# Patient Record
Sex: Male | Born: 1980 | Race: White | Hispanic: No | Marital: Single | Smoking: Never smoker
Health system: Southern US, Community
[De-identification: ages and names within clinical notes are randomized; demographics above are authoritative.]

## PROBLEM LIST (undated history)

## (undated) DIAGNOSIS — S52501A Unspecified fracture of the lower end of right radius, initial encounter for closed fracture: Secondary | ICD-10-CM

## (undated) DIAGNOSIS — F40231 Fear of injections and transfusions: Secondary | ICD-10-CM

## (undated) DIAGNOSIS — R12 Heartburn: Secondary | ICD-10-CM

## (undated) HISTORY — PX: WISDOM TOOTH EXTRACTION: SHX21

## (undated) HISTORY — PX: MULTIPLE TOOTH EXTRACTIONS: SHX2053

---

## 2000-08-02 ENCOUNTER — Emergency Department (HOSPITAL_COMMUNITY): Admission: EM | Admit: 2000-08-02 | Discharge: 2000-08-02 | Payer: Self-pay

## 2015-04-04 ENCOUNTER — Ambulatory Visit: Payer: Self-pay

## 2015-06-27 ENCOUNTER — Encounter (HOSPITAL_COMMUNITY): Payer: Self-pay | Admitting: Emergency Medicine

## 2015-06-27 ENCOUNTER — Emergency Department (HOSPITAL_COMMUNITY): Payer: Worker's Compensation

## 2015-06-27 ENCOUNTER — Emergency Department (HOSPITAL_COMMUNITY)
Admission: EM | Admit: 2015-06-27 | Discharge: 2015-06-28 | Disposition: A | Discharge status: Home / Self Care | Payer: Worker's Compensation | Attending: Emergency Medicine | Admitting: Emergency Medicine

## 2015-06-27 DIAGNOSIS — Y92828 Other wilderness area as the place of occurrence of the external cause: Secondary | ICD-10-CM | POA: Diagnosis not present

## 2015-06-27 DIAGNOSIS — S52591A Other fractures of lower end of right radius, initial encounter for closed fracture: Secondary | ICD-10-CM | POA: Diagnosis not present

## 2015-06-27 DIAGNOSIS — Y9355 Activity, bike riding: Secondary | ICD-10-CM | POA: Diagnosis not present

## 2015-06-27 DIAGNOSIS — S52611A Displaced fracture of right ulna styloid process, initial encounter for closed fracture: Secondary | ICD-10-CM | POA: Insufficient documentation

## 2015-06-27 DIAGNOSIS — S5291XA Unspecified fracture of right forearm, initial encounter for closed fracture: Secondary | ICD-10-CM

## 2015-06-27 DIAGNOSIS — S6991XA Unspecified injury of right wrist, hand and finger(s), initial encounter: Secondary | ICD-10-CM | POA: Diagnosis present

## 2015-06-27 DIAGNOSIS — S52201A Unspecified fracture of shaft of right ulna, initial encounter for closed fracture: Secondary | ICD-10-CM

## 2015-06-27 DIAGNOSIS — Y998 Other external cause status: Secondary | ICD-10-CM | POA: Insufficient documentation

## 2015-06-27 DIAGNOSIS — Z791 Long term (current) use of non-steroidal anti-inflammatories (NSAID): Secondary | ICD-10-CM | POA: Diagnosis not present

## 2015-06-27 DIAGNOSIS — S52501A Unspecified fracture of the lower end of right radius, initial encounter for closed fracture: Secondary | ICD-10-CM

## 2015-06-27 HISTORY — DX: Unspecified fracture of the lower end of right radius, initial encounter for closed fracture: S52.501A
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
## 2015-06-27 NOTE — ED Notes (Signed)
Bed: [id number] Expected date:  Expected time:  Means of arrival:  Comments: EMS/25F/fall/? R hip deformity

## 2015-06-28 MED ORDER — HYDROCODONE-ACETAMINOPHEN 5-325 MG PO TABS: 1.0000 | ORAL_TABLET | Freq: Four times a day (QID) | ORAL | Status: DC | PRN

## 2015-06-28 NOTE — Discharge Instructions (Signed)
°Forearm Fracture °A forearm fracture is a break in one or both of the bones of your arm that are between the elbow and the wrist. Your forearm is made up of two bones: °· Radius. This is the bone on the inside of your arm near your thumb. °· Ulna. This is the bone on the outside of your arm near your little finger. °Middle forearm fractures usually break both the radius and the ulna. Most forearm fractures that involve both the ulna and radius will require surgery. °CAUSES °Common causes of this type of fracture include: °· Falling on an outstretched arm. °· Accidents, such as a car or bike accident. °· A hard, direct hit to the middle part of your arm. °RISK FACTORS °You may be at higher risk for this type of fracture if: °· You play contact sports. °· You have a condition that causes your bones to be weak or thin (osteoporosis). °SIGNS AND SYMPTOMS °A forearm fracture causes pain immediately after the injury. Other signs and symptoms include: °· An abnormal bend or bump in your arm (deformity). °· Swelling. °· Numbness or tingling. °· Tenderness. °· Inability to turn your hand from side to side (rotate). °· Bruising. °DIAGNOSIS °Your health care provider may diagnose a forearm fracture based on: °· Your symptoms. °· Your medical history, including any recent injury. °· A physical exam. Your health care provider will look for any deformity and feel for tenderness over the break. Your health care provider will also check whether the bones are out of place. °· An X-ray exam to confirm the diagnosis and learn more about the type of fracture. °TREATMENT °The goals of treatment are to get the bone or bones in proper position for healing and to keep the bones from moving so they will heal over time. Your treatment will depend on many factors, especially the type of fracture that you have. °· If the fractured bone or bones: °¨ Are in the correct position (nondisplaced), you may only need to wear a cast or a  splint. °¨ Have a slightly displaced fracture, you may need to have the bones moved back into place manually (closed reduction) before the splint or cast is put on. °· You may have a temporary splint before you have a cast. The splint allows room for some swelling. After a few days, a cast can replace the splint. °· You may have to wear the cast for 6-8 weeks or as directed by your health care provider. °· The cast may be changed after about 3 weeks or as directed by your health care provider. °· After your cast is removed, you may need physical therapy to regain full movement in your wrist or elbow. °· You may need emergency surgery if you have: °¨ A fractured bone or bones that are out of position (displaced). °¨ A fracture with multiple fragments (comminuted fracture). °¨ A fracture that breaks the skin (open fracture). This type of fracture may require surgical wires, plates, or screws to hold the bone or bones in place. °· You may have X-rays every couple of weeks to check on your healing. °HOME CARE INSTRUCTIONS °If You Have a Cast: °· Do not stick anything inside the cast to scratch your skin. Doing that increases your risk of infection. °· Check the skin around the cast every day. Report any concerns to your health care provider. You may put lotion on dry skin around the edges of the cast. Do not apply lotion to the skin   underneath the cast. °If You Have a Splint: °· Wear it as directed by your health care provider. Remove it only as directed by your health care provider. °· Loosen the splint if your fingers become numb and tingle, or if they turn cold and blue. °Bathing °· Cover the cast or splint with a watertight plastic bag to protect it from water while you bathe or shower. Do not let the cast or splint get wet. °Managing Pain, Stiffness, and Swelling °· If directed, apply ice to the injured area: °¨ Put ice in a plastic bag. °¨ Place a towel between your skin and the bag. °¨ Leave the ice on for 20  minutes, 2-3 times a day. °· Move your fingers often to avoid stiffness and to lessen swelling. °· Raise the injured area above the level of your heart while you are sitting or lying down. °Driving °· Do not drive or operate heavy machinery while taking pain medicine. °· Do not drive while wearing a cast or splint on a hand that you use for driving. °Activity °· Return to your normal activities as directed by your health care provider. Ask your health care provider what activities are safe for you. °· Perform range-of-motion exercises only as directed by your health care provider. °Safety °· Do not use your injured limb to support your body weight until your health care provider says that you can. °General Instructions °· Do not put pressure on any part of the cast or splint until it is fully hardened. This may take several hours. °· Keep the cast or splint clean and dry. °· Do not use any tobacco products, including cigarettes, chewing tobacco, or electronic cigarettes. Tobacco can delay bone healing. If you need help quitting, ask your health care provider. °· Take medicines only as directed by your health care provider. °· Keep all follow-up visits as directed by your health care provider. This is important. °SEEK MEDICAL CARE IF: °· Your pain medicine is not helping. °· Your cast or splint becomes wet or damaged or suddenly feels too tight. °· Your cast becomes loose. °· You have more severe pain or swelling than you did before the cast. °· You have severe pain when you stretch your fingers. °· You continue to have pain or stiffness in your elbow or your wrist after your cast is removed. °SEEK IMMEDIATE MEDICAL CARE IF: °· You cannot move your fingers. °· You lose feeling in your fingers or your hand. °· Your hand or your fingers turn cold and pale or blue. °· You notice a bad smell coming from your cast. °· You have drainage from underneath your cast. °· You have new stains from blood or drainage that is coming  through your cast. °  °This information is not intended to replace advice given to you by your health care provider. Make sure you discuss any questions you have with your health care provider. °  °Document Released: 08/21/2000 Document Revised: 09/14/2014 Document Reviewed: 04/09/2014 °Elsevier Interactive Patient Education ©2016 [employer] ° ° °Cast or Splint Care °Casts and splints support injured limbs and keep bones from moving while they heal. It is important to care for your cast or splint at home.   °HOME CARE INSTRUCTIONS °· Keep the cast or splint uncovered during the drying period. It can take 24 to 48 hours to dry if it is made of plaster. A fiberglass cast will dry in less than 1 hour. °· Do not rest the cast on anything harder   than a pillow for the first 24 hours. °· Do not put weight on your injured limb or apply pressure to the cast until your health care provider gives you permission. °· Keep the cast or splint dry. Wet casts or splints can lose their shape and may not support the limb as well. A wet cast that has lost its shape can also create harmful pressure on your skin when it dries. Also, wet skin can become infected. °· Cover the cast or splint with a plastic bag when bathing or when out in the rain or snow. If the cast is on the trunk of the body, take sponge baths until the cast is removed. °· If your cast does become wet, dry it with a towel or a blow dryer on the cool setting only. °· Keep your cast or splint clean. Soiled casts may be wiped with a moistened cloth. °· Do not place any hard or soft foreign objects under your cast or splint, such as cotton, toilet paper, lotion, or powder. °· Do not try to scratch the skin under the cast with any object. The object could get stuck inside the cast. Also, scratching could lead to an infection. If itching is a problem, use a blow dryer on a cool setting to relieve discomfort. °· Do not trim or cut your cast or remove padding from inside of  it. °· Exercise all joints next to the injury that are not immobilized by the cast or splint. For example, if you have a long leg cast, exercise the hip joint and toes. If you have an arm cast or splint, exercise the shoulder, elbow, thumb, and fingers. °· Elevate your injured arm or leg on 1 or 2 pillows for the first 1 to 3 days to decrease swelling and pain. It is best if you can comfortably elevate your cast so it is higher than your heart. °SEEK MEDICAL CARE IF:  °· Your cast or splint cracks. °· Your cast or splint is too tight or too loose. °· You have unbearable itching inside the cast. °· Your cast becomes wet or develops a soft spot or area. °· You have a bad smell coming from inside your cast. °· You get an object stuck under your cast. °· Your skin around the cast becomes red or raw. °· You have new pain or worsening pain after the cast has been applied. °SEEK IMMEDIATE MEDICAL CARE IF:  °· You have fluid leaking through the cast. °· You are unable to move your fingers or toes. °· You have discolored (blue or white), cool, painful, or very swollen fingers or toes beyond the cast. °· You have tingling or numbness around the injured area. °· You have severe pain or pressure under the cast. °· You have any difficulty with your breathing or have shortness of breath. °· You have chest pain. °  °This information is not intended to replace advice given to you by your health care provider. Make sure you discuss any questions you have with your health care provider. °  °Document Released: 08/21/2000 Document Revised: 06/14/2013 Document Reviewed: 03/02/2013 °Elsevier Interactive Patient Education ©2016 [employer] ° °

## 2015-07-01 ENCOUNTER — Encounter (HOSPITAL_BASED_OUTPATIENT_CLINIC_OR_DEPARTMENT_OTHER): Payer: Self-pay | Admitting: *Deleted

## 2015-07-01 ENCOUNTER — Other Ambulatory Visit: Payer: Self-pay | Admitting: Orthopedic Surgery

## 2015-07-04 ENCOUNTER — Ambulatory Visit (HOSPITAL_BASED_OUTPATIENT_CLINIC_OR_DEPARTMENT_OTHER): Payer: Worker's Compensation | Admitting: Anesthesiology

## 2015-07-04 ENCOUNTER — Encounter (HOSPITAL_BASED_OUTPATIENT_CLINIC_OR_DEPARTMENT_OTHER)
Admission: RE | Disposition: A | Discharge status: Home / Self Care | Payer: Self-pay | Source: Ambulatory Visit | Attending: Orthopedic Surgery

## 2015-07-04 ENCOUNTER — Ambulatory Visit (HOSPITAL_BASED_OUTPATIENT_CLINIC_OR_DEPARTMENT_OTHER)
Admission: RE | Admit: 2015-07-04 | Discharge: 2015-07-04 | Disposition: A | Discharge status: Home / Self Care | Payer: Worker's Compensation | Source: Ambulatory Visit | Attending: Orthopedic Surgery | Admitting: Orthopedic Surgery

## 2015-07-04 ENCOUNTER — Encounter (HOSPITAL_BASED_OUTPATIENT_CLINIC_OR_DEPARTMENT_OTHER): Payer: Self-pay | Admitting: *Deleted

## 2015-07-04 DIAGNOSIS — S52571A Other intraarticular fracture of lower end of right radius, initial encounter for closed fracture: Secondary | ICD-10-CM | POA: Insufficient documentation

## 2015-07-04 DIAGNOSIS — Y990 Civilian activity done for income or pay: Secondary | ICD-10-CM | POA: Diagnosis not present

## 2015-07-04 DIAGNOSIS — S52501A Unspecified fracture of the lower end of right radius, initial encounter for closed fracture: Secondary | ICD-10-CM | POA: Diagnosis present

## 2015-07-04 HISTORY — DX: Heartburn: R12

## 2015-07-04 HISTORY — DX: Fear of injections and transfusions: F40.231

## 2015-07-04 HISTORY — DX: Unspecified fracture of the lower end of right radius, initial encounter for closed fracture: S52.501A

## 2015-07-04 HISTORY — PX: OPEN REDUCTION INTERNAL FIXATION (ORIF) DISTAL RADIAL FRACTURE: SHX5989

## 2015-07-04 SURGERY — OPEN REDUCTION INTERNAL FIXATION (ORIF) DISTAL RADIUS FRACTURE
Anesthesia: General | Site: Wrist | Laterality: Right

## 2015-07-04 MED ORDER — GLYCOPYRROLATE 0.2 MG/ML IJ SOLN: 0.2000 mg | Freq: Once | INTRAMUSCULAR | Status: DC | PRN

## 2015-07-04 MED ORDER — OXYCODONE HCL 5 MG/5ML PO SOLN
5.0000 mg | Freq: Once | ORAL | Status: AC | PRN
Start: 2015-07-04 — End: 2015-07-04

## 2015-07-04 MED ORDER — MIDAZOLAM HCL 2 MG/2ML IJ SOLN
INTRAMUSCULAR | Status: AC
  Filled 2015-07-04: qty 2

## 2015-07-04 MED ORDER — FENTANYL CITRATE (PF) 100 MCG/2ML IJ SOLN
50.0000 ug | INTRAMUSCULAR | Status: AC | PRN
  Administered 2015-07-04: 100 ug via INTRAVENOUS
  Administered 2015-07-04 (×2): 25 ug via INTRAVENOUS

## 2015-07-04 MED ORDER — HYDROMORPHONE HCL 1 MG/ML IJ SOLN
INTRAMUSCULAR | Status: AC
  Filled 2015-07-04: qty 1

## 2015-07-04 MED ORDER — ONDANSETRON HCL 4 MG/2ML IJ SOLN
INTRAMUSCULAR | Status: AC
  Filled 2015-07-04: qty 2

## 2015-07-04 MED ORDER — MIDAZOLAM HCL 2 MG/2ML IJ SOLN
INTRAMUSCULAR | Status: AC
  Filled 2015-07-04: qty 4

## 2015-07-04 MED ORDER — DEXAMETHASONE SODIUM PHOSPHATE 10 MG/ML IJ SOLN
INTRAMUSCULAR | Status: AC
  Filled 2015-07-04: qty 1

## 2015-07-04 MED ORDER — FENTANYL CITRATE (PF) 100 MCG/2ML IJ SOLN
INTRAMUSCULAR | Status: AC
  Filled 2015-07-04: qty 4

## 2015-07-04 MED ORDER — OXYCODONE HCL 5 MG PO TABS
ORAL_TABLET | ORAL | Status: AC
  Filled 2015-07-04: qty 1

## 2015-07-04 MED ORDER — HYDROMORPHONE HCL 1 MG/ML IJ SOLN
0.2500 mg | INTRAMUSCULAR | Status: DC | PRN
Start: 2015-07-04 — End: 2015-07-04
  Administered 2015-07-04 (×2): 0.5 mg via INTRAVENOUS

## 2015-07-04 MED ORDER — CHLORHEXIDINE GLUCONATE 4 % EX LIQD: 60.0000 mL | Freq: Once | CUTANEOUS | Status: DC

## 2015-07-04 MED ORDER — OXYCODONE HCL 5 MG PO TABS
5.0000 mg | ORAL_TABLET | Freq: Once | ORAL | Status: AC | PRN
  Administered 2015-07-04: 5 mg via ORAL

## 2015-07-04 MED ORDER — CEFAZOLIN SODIUM-DEXTROSE 2-3 GM-% IV SOLR
INTRAVENOUS | Status: AC
  Filled 2015-07-04: qty 50

## 2015-07-04 MED ORDER — PROPOFOL 10 MG/ML IV BOLUS
INTRAVENOUS | Status: DC | PRN
  Administered 2015-07-04: 200 mg via INTRAVENOUS

## 2015-07-04 MED ORDER — ONDANSETRON HCL 4 MG/2ML IJ SOLN
INTRAMUSCULAR | Status: DC | PRN
  Administered 2015-07-04: 4 mg via INTRAVENOUS

## 2015-07-04 MED ORDER — DEXAMETHASONE SODIUM PHOSPHATE 4 MG/ML IJ SOLN
INTRAMUSCULAR | Status: DC | PRN
  Administered 2015-07-04: 10 mg via INTRAVENOUS

## 2015-07-04 MED ORDER — LIDOCAINE HCL (CARDIAC) 20 MG/ML IV SOLN
INTRAVENOUS | Status: AC
  Filled 2015-07-04: qty 5

## 2015-07-04 MED ORDER — LACTATED RINGERS IV SOLN
INTRAVENOUS | Status: DC
  Administered 2015-07-04 (×2): via INTRAVENOUS
  Administered 2015-07-04: 10 mL/h via INTRAVENOUS

## 2015-07-04 MED ORDER — SCOPOLAMINE 1 MG/3DAYS TD PT72: 1.0000 | MEDICATED_PATCH | Freq: Once | TRANSDERMAL | Status: DC | PRN

## 2015-07-04 MED ORDER — OXYCODONE-ACETAMINOPHEN 5-325 MG PO TABS: ORAL_TABLET | ORAL | Status: AC

## 2015-07-04 MED ORDER — PROMETHAZINE HCL 25 MG/ML IJ SOLN
6.2500 mg | INTRAMUSCULAR | Status: DC | PRN
Start: 2015-07-04 — End: 2015-07-04

## 2015-07-04 MED ORDER — LIDOCAINE HCL (CARDIAC) 20 MG/ML IV SOLN
INTRAVENOUS | Status: DC | PRN
  Administered 2015-07-04: 60 mg via INTRAVENOUS

## 2015-07-04 MED ORDER — CEFAZOLIN SODIUM-DEXTROSE 2-3 GM-% IV SOLR
2.0000 g | INTRAVENOUS | Status: AC
  Administered 2015-07-04: 2 g via INTRAVENOUS

## 2015-07-04 MED ORDER — BUPIVACAINE HCL (PF) 0.25 % IJ SOLN
INTRAMUSCULAR | Status: DC | PRN
  Administered 2015-07-04: 10 mL

## 2015-07-04 MED ORDER — KETOROLAC TROMETHAMINE 30 MG/ML IJ SOLN: 30.0000 mg | Freq: Once | INTRAMUSCULAR | Status: DC

## 2015-07-04 MED ORDER — DIAZEPAM 5 MG PO TABS
10.0000 mg | ORAL_TABLET | ORAL | Status: AC
  Administered 2015-07-04: 10 mg via ORAL
  Filled 2015-07-04: qty 2

## 2015-07-04 MED ORDER — MIDAZOLAM HCL 2 MG/2ML IJ SOLN
1.0000 mg | INTRAMUSCULAR | Status: DC | PRN
  Administered 2015-07-04 (×2): 2 mg via INTRAVENOUS

## 2015-07-04 SURGICAL SUPPLY — 70 items
BANDAGE ELASTIC 3 VELCRO ST LF (GAUZE/BANDAGES/DRESSINGS) ×3 IMPLANT
BIT DRILL 2.0 LNG QUCK RELEASE (BIT) ×1 IMPLANT
BIT DRILL 2.8X5 QR DISP (BIT) ×3 IMPLANT
BLADE SURG 15 STRL LF DISP TIS (BLADE) ×2 IMPLANT
BLADE SURG 15 STRL SS (BLADE) ×4
BNDG ESMARK 4X9 LF (GAUZE/BANDAGES/DRESSINGS) ×3 IMPLANT
BNDG GAUZE ELAST 4 BULKY (GAUZE/BANDAGES/DRESSINGS) ×3 IMPLANT
BNDG PLASTER X FAST 3X3 WHT LF (CAST SUPPLIES) ×30 IMPLANT
CHLORAPREP W/TINT 26ML (MISCELLANEOUS) ×3 IMPLANT
CORDS BIPOLAR (ELECTRODE) ×3 IMPLANT
COVER BACK TABLE 60X90IN (DRAPES) ×3 IMPLANT
COVER MAYO STAND STRL (DRAPES) ×3 IMPLANT
DRAPE EXTREMITY T 121X128X90 (DRAPE) ×3 IMPLANT
DRAPE OEC MINIVIEW 54X84 (DRAPES) ×3 IMPLANT
DRAPE SURG 17X23 STRL (DRAPES) ×3 IMPLANT
DRILL 2.0 LNG QUICK RELEASE (BIT) ×3
GAUZE SPONGE 4X4 12PLY STRL (GAUZE/BANDAGES/DRESSINGS) ×3 IMPLANT
GAUZE XEROFORM 1X8 LF (GAUZE/BANDAGES/DRESSINGS) ×3 IMPLANT
GLOVE BIO SURGEON STRL SZ7.5 (GLOVE) ×3 IMPLANT
GLOVE BIOGEL M STRL SZ7.5 (GLOVE) ×3 IMPLANT
GLOVE BIOGEL PI IND STRL 7.0 (GLOVE) ×1 IMPLANT
GLOVE BIOGEL PI IND STRL 8 (GLOVE) ×1 IMPLANT
GLOVE BIOGEL PI IND STRL 8.5 (GLOVE) ×1 IMPLANT
GLOVE BIOGEL PI INDICATOR 7.0 (GLOVE) ×2
GLOVE BIOGEL PI INDICATOR 8 (GLOVE) ×2
GLOVE BIOGEL PI INDICATOR 8.5 (GLOVE) ×2
GLOVE SURG ORTHO 8.0 STRL STRW (GLOVE) ×3 IMPLANT
GOWN STRL REUS W/ TWL LRG LVL3 (GOWN DISPOSABLE) ×1 IMPLANT
GOWN STRL REUS W/TWL LRG LVL3 (GOWN DISPOSABLE) ×2
GOWN STRL REUS W/TWL XL LVL3 (GOWN DISPOSABLE) ×3 IMPLANT
GUIDEWIRE ORTHO 0.054X6 (WIRE) ×9 IMPLANT
NEEDLE HYPO 25X1 1.5 SAFETY (NEEDLE) ×3 IMPLANT
NS IRRIG 1000ML POUR BTL (IV SOLUTION) ×3 IMPLANT
PACK BASIN DAY SURGERY FS (CUSTOM PROCEDURE TRAY) ×3 IMPLANT
PAD CAST 3X4 CTTN HI CHSV (CAST SUPPLIES) ×1 IMPLANT
PADDING CAST ABS 4INX4YD NS (CAST SUPPLIES) ×2
PADDING CAST ABS COTTON 4X4 ST (CAST SUPPLIES) ×1 IMPLANT
PADDING CAST COTTON 3X4 STRL (CAST SUPPLIES) ×2
PLATE PROXIMAL VDU ACULOC (Plate) ×3 IMPLANT
SCREW CORT FT 22X2.3XLCK HEX (Screw) ×2 IMPLANT
SCREW CORTICAL LOCKING 2.3X18M (Screw) ×2 IMPLANT
SCREW CORTICAL LOCKING 2.3X20M (Screw) ×4 IMPLANT
SCREW CORTICAL LOCKING 2.3X22M (Screw) ×6 IMPLANT
SCREW FX18X2.3XSMTH LCK NS CRT (Screw) ×1 IMPLANT
SCREW FX20X2.3XSMTH LCK NS CRT (Screw) ×2 IMPLANT
SCREW FX22X2.3XLCK SMTH NS CRT (Screw) ×1 IMPLANT
SCREW HEX 3.5X15 NLCKG STRL (Screw) ×1 IMPLANT
SCREW HEX 3.5X15MM (Screw) ×3 IMPLANT
SCREW HEXALOBE NON-LOCK 3.5X14 (Screw) ×3 IMPLANT
SCREW HEXALOBE NON-LOCK 3.5X16 (Screw) ×3 IMPLANT
SCREW NLCKG 13 3.5X13 HEXA (Screw) ×1 IMPLANT
SCREW NON TOGG 2.3X22MM (Screw) ×3 IMPLANT
SCREW NON-LOCK 3.5X13 (Screw) ×3 IMPLANT
SLEEVE SCD COMPRESS KNEE MED (MISCELLANEOUS) ×3 IMPLANT
STOCKINETTE 4X48 STRL (DRAPES) ×3 IMPLANT
SUCTION FRAZIER TIP 10 FR DISP (SUCTIONS) IMPLANT
SUT ETHILON 3 0 PS 1 (SUTURE) IMPLANT
SUT ETHILON 4 0 PS 2 18 (SUTURE) ×3 IMPLANT
SUT VIC AB 2-0 SH 27 (SUTURE)
SUT VIC AB 2-0 SH 27XBRD (SUTURE) IMPLANT
SUT VIC AB 3-0 PS1 18 (SUTURE)
SUT VIC AB 3-0 PS1 18XBRD (SUTURE) IMPLANT
SUT VICRYL 4-0 PS2 18IN ABS (SUTURE) ×3 IMPLANT
SYR BULB 3OZ (MISCELLANEOUS) ×3 IMPLANT
SYR CONTROL 10ML LL (SYRINGE) IMPLANT
TOWEL OR 17X24 6PK STRL BLUE (TOWEL DISPOSABLE) ×6 IMPLANT
TOWEL OR NON WOVEN STRL DISP B (DISPOSABLE) ×3 IMPLANT
TUBE CONNECTING 20'X1/4 (TUBING)
TUBE CONNECTING 20X1/4 (TUBING) IMPLANT
UNDERPAD 30X30 (UNDERPADS AND DIAPERS) ×3 IMPLANT
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
## 2015-07-04 NOTE — Anesthesia Postprocedure Evaluation (Signed)
  Anesthesia Post-op Note  Patient: Jacob MuscatMatthew G Harper  Procedure(s) Performed: Procedure(s): OPEN REDUCTION INTERNAL FIXATION (ORIF) RIGHT DISTAL RADIUS (Right)  Patient Location: PACU  Anesthesia Type: General   Level of Consciousness: awake, alert  and oriented  Airway and Oxygen Therapy: Patient Spontanous Breathing  Post-op Pain: mild  Post-op Assessment: Post-op Vital signs reviewed  Post-op Vital Signs: Reviewed  Last Vitals:  Filed Vitals:   07/04/15 1338  BP: 123/76  Pulse: 67  Temp: 36.5 C  Resp: 16    Complications: No apparent anesthesia complications

## 2015-07-04 NOTE — Discharge Instructions (Addendum)

## 2015-07-04 NOTE — Brief Op Note (Signed)
07/04/2015  12:12 PM  PATIENT:  Jacob Harper  34 y.o. male  PRE-OPERATIVE DIAGNOSIS:  RIGHT DISTAL RADIUS FRACTURE   POST-OPERATIVE DIAGNOSIS:  RIGHT DISTAL RADIUS FRACTURE  PROCEDURE:  Procedure(s): OPEN REDUCTION INTERNAL FIXATION (ORIF) RIGHT DISTAL RADIUS (Right), right brachioradialis release  SURGEON:  Surgeon(s) and Role:    * Betha LoaKevin Ivis Henneman, MD - Primary  PHYSICIAN ASSISTANT:   ASSISTANTS: Annye Ruskobert Dasnoit, PA   ANESTHESIA:   general  EBL:  Total I/O In: 1000 [I.V.:1000] Out: -   BLOOD ADMINISTERED:none  DRAINS: none   LOCAL MEDICATIONS USED:  MARCAINE     SPECIMEN:  No Specimen  DISPOSITION OF SPECIMEN:  N/A  COUNTS:  YES  TOURNIQUET:   Total Tourniquet Time Documented: Upper Arm (Right) - 72 minutes Total: Upper Arm (Right) - 72 minutes   DICTATION: .Other Dictation: Dictation Number [id number]  PLAN OF CARE: Discharge to home after PACU  PATIENT DISPOSITION:  PACU - hemodynamically stable.

## 2015-07-04 NOTE — Op Note (Signed)
Intra-operative fluoroscopic images in the AP, lateral, and oblique views were taken and evaluated by myself.  Reduction and hardware placement were confirmed.  There was no intraarticular penetration of permanent hardware.  

## 2015-07-04 NOTE — Transfer of Care (Signed)
Immediate Anesthesia Transfer of Care Note  Patient: Jacob MuscatMatthew G Umscheid  Procedure(s) Performed: Procedure(s): OPEN REDUCTION INTERNAL FIXATION (ORIF) RIGHT DISTAL RADIUS (Right)  Patient Location: PACU  Anesthesia Type:General  Level of Consciousness: sedated, lethargic and responds to stimulation  Airway & Oxygen Therapy: Patient Spontanous Breathing and Patient connected to face mask oxygen  Post-op Assessment: Report given to RN and Post -op Vital signs reviewed and stable  Post vital signs: Reviewed and stable  Last Vitals:  Filed Vitals:   07/04/15 1215  BP: 108/53  Pulse:   Temp:   Resp:     Complications: No apparent anesthesia complications

## 2015-07-04 NOTE — Anesthesia Preprocedure Evaluation (Addendum)
Anesthesia Evaluation  Patient identified by MRN, date of birth, ID band Patient awake    Reviewed: Allergy & Precautions, NPO status , Patient's Chart, lab work & pertinent test results  Airway Mallampati: II  TM Distance: >3 FB Neck ROM: Full    Dental   Pulmonary neg pulmonary ROS,  breath sounds clear to auscultation        Cardiovascular negative cardio ROS  Rhythm:Regular Rate:Normal     Neuro/Psych negative neurological ROS     GI/Hepatic negative GI ROS, Neg liver ROS,   Endo/Other  negative endocrine ROS  Renal/GU negative Renal ROS     Musculoskeletal   Abdominal   Peds  Hematology negative hematology ROS (+)   Anesthesia Other Findings   Reproductive/Obstetrics                            Anesthesia Physical Anesthesia Plan  ASA: I  Anesthesia Plan: General   Post-op Pain Management:    Induction: Intravenous  Airway Management Planned: LMA  Additional Equipment:   Intra-op Plan:   Post-operative Plan:   Informed Consent: I have reviewed the patients History and Physical, chart, labs and discussed the procedure including the risks, benefits and alternatives for the proposed anesthesia with the patient or authorized representative who has indicated his/her understanding and acceptance.     Plan Discussed with: CRNA  Anesthesia Plan Comments:        Anesthesia Quick Evaluation  

## 2015-07-04 NOTE — Op Note (Signed)
[id number] 

## 2015-07-05 ENCOUNTER — Encounter (HOSPITAL_BASED_OUTPATIENT_CLINIC_OR_DEPARTMENT_OTHER): Payer: Self-pay | Admitting: Orthopedic Surgery
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
